# Patient Record
Sex: Male | Born: 2000 | Race: White | Hispanic: No | Marital: Single | Smoking: Current every day smoker
Health system: Southern US, Community
[De-identification: ages and names within clinical notes are randomized; demographics above are authoritative.]

## PROBLEM LIST (undated history)

## (undated) DIAGNOSIS — F419 Anxiety disorder, unspecified: Secondary | ICD-10-CM

---

## 2020-05-06 ENCOUNTER — Emergency Department (HOSPITAL_COMMUNITY)
Admission: EM | Admit: 2020-05-06 | Discharge: 2020-05-06 | Disposition: A | Discharge status: Home / Self Care | Payer: 59 | Attending: Emergency Medicine | Admitting: Emergency Medicine

## 2020-05-06 ENCOUNTER — Encounter (HOSPITAL_COMMUNITY): Payer: Self-pay | Admitting: Emergency Medicine

## 2020-05-06 ENCOUNTER — Emergency Department (HOSPITAL_COMMUNITY): Payer: 59

## 2020-05-06 DIAGNOSIS — R4182 Altered mental status, unspecified: Secondary | ICD-10-CM | POA: Diagnosis not present

## 2020-05-06 DIAGNOSIS — F17200 Nicotine dependence, unspecified, uncomplicated: Secondary | ICD-10-CM | POA: Insufficient documentation

## 2020-05-06 DIAGNOSIS — Z20822 Contact with and (suspected) exposure to covid-19: Secondary | ICD-10-CM | POA: Insufficient documentation

## 2020-05-06 DIAGNOSIS — F419 Anxiety disorder, unspecified: Secondary | ICD-10-CM | POA: Diagnosis not present

## 2020-05-06 DIAGNOSIS — F410 Panic disorder [episodic paroxysmal anxiety] without agoraphobia: Secondary | ICD-10-CM

## 2020-05-06 HISTORY — DX: Anxiety disorder, unspecified: F41.9

## 2020-05-06 LAB — URINALYSIS, ROUTINE W REFLEX MICROSCOPIC
Bilirubin Urine: NEGATIVE
Glucose, UA: NEGATIVE mg/dL
Hgb urine dipstick: NEGATIVE
Ketones, ur: NEGATIVE mg/dL
Leukocytes,Ua: NEGATIVE
Nitrite: NEGATIVE
Protein, ur: NEGATIVE mg/dL
Specific Gravity, Urine: 1.017 (ref 1.005–1.030)
pH: 7 (ref 5.0–8.0)

## 2020-05-06 LAB — RAPID URINE DRUG SCREEN, HOSP PERFORMED
Amphetamines: NOT DETECTED
Barbiturates: NOT DETECTED
Benzodiazepines: NOT DETECTED
Cocaine: NOT DETECTED
Opiates: NOT DETECTED
Tetrahydrocannabinol: POSITIVE — AB

## 2020-05-06 LAB — COMPREHENSIVE METABOLIC PANEL
ALT: 17 U/L (ref 0–44)
AST: 23 U/L (ref 15–41)
Albumin: 4.5 g/dL (ref 3.5–5.0)
Alkaline Phosphatase: 58 U/L (ref 38–126)
Anion gap: 9 (ref 5–15)
BUN: 10 mg/dL (ref 6–20)
CO2: 25 mmol/L (ref 22–32)
Calcium: 9.7 mg/dL (ref 8.9–10.3)
Chloride: 110 mmol/L (ref 98–111)
Creatinine, Ser: 1.02 mg/dL (ref 0.61–1.24)
GFR, Estimated: >60 mL/min (ref >60)
Glucose, Bld: 96 mg/dL (ref 70–99)
Potassium: 4.1 mmol/L (ref 3.5–5.1)
Sodium: 144 mmol/L (ref 135–145)
Total Bilirubin: 0.1 mg/dL — ABNORMAL LOW (ref 0.3–1.2)
Total Protein: 7 g/dL (ref 6.5–8.1)

## 2020-05-06 LAB — CBC WITH DIFFERENTIAL/PLATELET
Abs Immature Granulocytes: 0.01 10*3/uL (ref 0.00–0.07)
Basophils Absolute: 0.1 10*3/uL (ref 0.0–0.1)
Basophils Relative: 1 %
Eosinophils Absolute: 0.1 10*3/uL (ref 0.0–0.5)
Eosinophils Relative: 1 %
HCT: 42.8 % (ref 39.0–52.0)
Hemoglobin: 14.3 g/dL (ref 13.0–17.0)
Immature Granulocytes: 0 %
Lymphocytes Relative: 57 %
Lymphs Abs: 4.1 10*3/uL — ABNORMAL HIGH (ref 0.7–4.0)
MCH: 30.1 pg (ref 26.0–34.0)
MCHC: 33.4 g/dL (ref 30.0–36.0)
MCV: 90.1 fL (ref 80.0–100.0)
Monocytes Absolute: 0.6 10*3/uL (ref 0.1–1.0)
Monocytes Relative: 8 %
Neutro Abs: 2.3 10*3/uL (ref 1.7–7.7)
Neutrophils Relative %: 33 %
Platelets: 250 10*3/uL (ref 150–400)
RBC: 4.75 MIL/uL (ref 4.22–5.81)
RDW: 12.2 % (ref 11.5–15.5)
WBC: 7.2 10*3/uL (ref 4.0–10.5)
nRBC: 0 % (ref 0.0–0.2)

## 2020-05-06 LAB — RESP PANEL BY RT-PCR (FLU A&B, COVID) ARPGX2
Influenza A by PCR: NEGATIVE
Influenza B by PCR: NEGATIVE
SARS Coronavirus 2 by RT PCR: NEGATIVE

## 2020-05-06 LAB — SALICYLATE LEVEL: Salicylate Lvl: <7 mg/dL — ABNORMAL LOW (ref 7.0–30.0)

## 2020-05-06 LAB — ETHANOL: Alcohol, Ethyl (B): <10 mg/dL (ref <10)

## 2020-05-06 LAB — CBG MONITORING, ED: Glucose-Capillary: 93 mg/dL (ref 70–99)

## 2020-05-06 LAB — ACETAMINOPHEN LEVEL: Acetaminophen (Tylenol), Serum: <10 ug/mL — ABNORMAL LOW (ref 10–30)

## 2020-05-06 MED ORDER — SODIUM CHLORIDE 0.9 % IV BOLUS
1000.0000 mL | Freq: Once | INTRAVENOUS | Status: AC
  Administered 2020-05-06: 1000 mL via INTRAVENOUS

## 2020-05-06 NOTE — BH Assessment (Signed)
Comprehensive Clinical Assessment (CCA) Note  05/06/2020 Jesse Hunter [id number]  Disposition: [name] recommends patient be discharged with resources provided.   Flowsheet Row ED from 05/06/2020 in [facility]  C-SSRS RISK CATEGORY No Risk    The patient demonstrates the following risk factors for suicide: Chronic risk factors for suicide include: N/A. Acute risk factors for suicide include: N/A. Protective factors for this patient include: positive social support. Considering these factors, the overall suicide risk at this point appears to be low. Patient is appropriate for outpatient follow up.  Patient presents voluntary to [facility] with ongoing anxiety and altered mental state on arrival. Patient denies any S/I, H/I or AVH. Patient denies any prior attempts or gestures at self harm. Patient denies any prior mental health diagnosis. Patient denies any SA issues. Patient reports he recently relocated to [place] from [place] last week to be with his girlfriend who is present at bedside. Patient states prior to coming to [facility] his PCP wrote for a prescription of Vistaril to assist with the anxiety associated with relocating. Patient states he has been taking that medication as directed. Patient reports he suffered a "panic attack" last night while spending the evening with his girlfriend that left him "numb all over." Patient states he "was scared that episode might be something health related" and came to [facility] to "get checked out."   Per note on arrival by Muthersbaugh PA-C who writes: Jesse Hunter is a 20 y.o. male presents to the Emergency Department complaining of acute episode of anxiety and AMS.  Pt reports he recently moved to [facility] and has had increased anxiety since that time.  He reports he is prescribed hydroxyzine for this which he has been taking more often this week.  Pt reports taking this 2x per day.  Denies taking more or OD of this  medication. Pt denies drugs and alcohol usage.  Denies other psychiatric history.  Patient denies other stressors aside from the move.  Reports he cannot remember the events of this evening but states nothing like this is ever happened before.  Denies auditory visual hallucinations, ideations or homicidal ideations.  History is aided by patient's friend at bedside. She reports he has had several episodes of "AMS" this evening beginning around 7pm.  She reports when this happens he gest a far away look on his face, his eyes dart around the room very quickly and he does not respond to voice.  Pt responds only after being touched and when touched patient is very surprised and confused.  He does not know where he is or what is going on.   Collateral was obtained from patient's step brother with patient's permission who reports he had no safety concerns in reference to patient being discharged. Patient's girlfriend at bedside also reports patient to her knowledge never has attempted self harm and voices no safety concerns. Patient is contracting for safety and is requesting too be discharged. Patient was provided Op resources and encouraged to follow up for counseling and further medication management if symptoms continue.   Patient is alert and oriented presenting with a pleasant affect. Patient's memory is intact with thoughts organized. Patient does not appear to be responding to internal stimuli.    Chief Complaint:  Chief Complaint  Patient presents with  . Anxiety   Visit Diagnosis: Adjustment disorder     CCA Screening, Triage and Referral (STR)  Patient Reported Information How did you hear about [place]? Self  Referral name: No data  recorded Referral phone number: No data recorded  Whom do you see for routine medical problems? I don't have a doctor  Practice/Facility Name: No data recorded Practice/Facility Phone Number: No data recorded Name of Contact: No data recorded Contact Number:  No data recorded Contact Fax Number: No data recorded Prescriber Name: No data recorded Prescriber Address (if known): No data recorded  What Is the Reason for Your Visit/Call Today? Anxiety  How Long Has This Been Causing You Problems? <Week  What Do You Feel Would Help You the Most Today? -- (Counseling)   Have You Recently Been in Any Inpatient Treatment (Hospital/Detox/Crisis Center/28-Day Program)? No  Name/Location of Program/Hospital:No data recorded How Long Were You There? No data recorded When Were You Discharged? No data recorded  Have You Ever Received Services From [facility] Before? No  Who Do You See at [facility]? No data recorded  Have You Recently Had Any Thoughts About Hurting Yourself? No  Are You Planning to Commit Suicide/Harm Yourself At This time? No   Have you Recently Had Thoughts About Hurting Someone Jesse Hunter? No  Explanation: No data recorded  Have You Used Any Alcohol or Drugs in the Past 24 Hours? No  How Long Ago Did You Use Drugs or Alcohol? No data recorded What Did You Use and How Much? No data recorded  Do You Currently Have a Therapist/Psychiatrist? No  Name of Therapist/Psychiatrist: No data recorded  Have You Been Recently Discharged From Any Office Practice or Programs? No  Explanation of Discharge From Practice/Program: No data recorded    CCA Screening Triage Referral Assessment Type of Contact: Face-to-Face  Is this Initial or Reassessment? No data recorded Date Telepsych consult ordered in [facility]:  No data recorded Time Telepsych consult ordered in [facility]:  No data recorded  Patient Reported Information Reviewed? Yes  Patient Left Without Being Seen? No data recorded Reason for Not Completing Assessment: No data recorded  Collateral Involvement: Brother   Does Patient Have a Automotive engineer Guardian? No data recorded Name and Contact of Legal Guardian: No data recorded If Minor and Not Living with Parent(s), Who  has Custody? No data recorded Is CPS involved or ever been involved? Never  Is APS involved or ever been involved? Never   Patient Determined To Be At Risk for Harm To Self or Others Based on Review of Patient Reported Information or Presenting Complaint? No  Method: No data recorded Availability of Means: No data recorded Intent: No data recorded Notification Required: No data recorded Additional Information for Danger to Others Potential: No data recorded Additional Comments for Danger to Others Potential: No data recorded Are There Guns or Other Weapons in Your Home? No data recorded Types of Guns/Weapons: No data recorded Are These Weapons Safely Secured?                            No data recorded Who Could Verify You Are Able To Have These Secured: No data recorded Do You Have any Outstanding Charges, Pending Court Dates, Parole/Probation? No data recorded Contacted To Inform of Risk of Harm To Self or Others: -- (NA)   Location of Assessment: No data recorded  Does Patient Present under Involuntary Commitment? No  IVC Papers Initial File Date: No data recorded  [place] of Residence: [place]   Patient Currently Receiving the Following Services: Not Receiving Services   Determination of Need: No data recorded  Options For Referral: Outpatient Therapy  CCA Biopsychosocial Intake/Chief Complaint:  No data recorded Current Symptoms/Problems: No data recorded  Patient Reported Schizophrenia/Schizoaffective Diagnosis in Past: No data recorded  Strengths: No data recorded Preferences: No data recorded Abilities: No data recorded  Type of Services Patient Feels are Needed: No data recorded  Initial Clinical Notes/Concerns: No data recorded  Mental Health Symptoms Depression:  No data recorded  Duration of Depressive symptoms: No data recorded  Mania:  No data recorded  Anxiety:   No data recorded  Psychosis:  No data recorded  Duration of Psychotic  symptoms: No data recorded  Trauma:  No data recorded  Obsessions:  No data recorded  Compulsions:  No data recorded  Inattention:  No data recorded  Hyperactivity/Impulsivity:  No data recorded  Oppositional/Defiant Behaviors:  No data recorded  Emotional Irregularity:  No data recorded  Other Mood/Personality Symptoms:  No data recorded   Mental Status Exam Appearance and self-care  Stature:  No data recorded  Weight:  No data recorded  Clothing:  No data recorded  Grooming:  No data recorded  Cosmetic use:  No data recorded  Posture/gait:  No data recorded  Motor activity:  No data recorded  Sensorium  Attention:  No data recorded  Concentration:  No data recorded  Orientation:  No data recorded  Recall/memory:  No data recorded  Affect and Mood  Affect:  No data recorded  Mood:  No data recorded  Relating  Eye contact:  No data recorded  Facial expression:  No data recorded  Attitude toward examiner:  No data recorded  Thought and Language  Speech flow: No data recorded  Thought content:  No data recorded  Preoccupation:  No data recorded  Hallucinations:  No data recorded  Organization:  No data recorded  [employer] of Knowledge:  No data recorded  Intelligence:  No data recorded  Abstraction:  No data recorded  Judgement:  No data recorded  Reality Testing:  No data recorded  Insight:  No data recorded  Decision Making:  No data recorded  Social Functioning  Social Maturity:  No data recorded  Social Judgement:  No data recorded  Stress  Stressors:  No data recorded  Coping Ability:  No data recorded  Skill Deficits:  No data recorded  Supports:  No data recorded    Religion:    Leisure/Recreation:    Exercise/Diet:     CCA Employment/Education Employment/Work Situation:    Education:     CCA Family/Childhood History Family and Relationship History:    Childhood History:     Child/Adolescent Assessment:     CCA  Substance Use Alcohol/Drug Use:                           ASAM's:  Six Dimensions of Multidimensional Assessment  Dimension 1:  Acute Intoxication and/or Withdrawal Potential:      Dimension 2:  Biomedical Conditions and Complications:      Dimension 3:  Emotional, Behavioral, or Cognitive Conditions and Complications:     Dimension 4:  Readiness to Change:     Dimension 5:  Relapse, Continued use, or Continued Problem Potential:     Dimension 6:  Recovery/Living Environment:     ASAM Severity Score:    ASAM Recommended Level of Treatment:     Substance use Disorder (SUD)    Recommendations for Services/Supports/Treatments:    DSM5 Diagnoses: There are no problems to display for this patient.  Patient Centered Plan: Patient is on the following Treatment Plan(s):     Referrals to Alternative Service(s): Referred to Alternative Service(s):   Place:   Date:   Time:    Referred to Alternative Service(s):   Place:   Date:   Time:    Referred to Alternative Service(s):   Place:   Date:   Time:    Referred to Alternative Service(s):   Place:   Date:   Time:     Alfredia FergusonDavid L Joshuwa Vecchio, LCAS

## 2020-05-06 NOTE — Discharge Instructions (Signed)
He was seen in the ER by behavioral health team.  Please follow up with your primary care doctor, psychiatrist or consider following with behavioral health urgent care for further help is needed

## 2020-05-06 NOTE — ED Triage Notes (Signed)
Patient here from home reporting anxiety attack today. States that he normally takes hydroxyzine for anxiety and has been taking it more frequently. New to the area and has anxiety related to that. AAO x4 in triage.

## 2020-05-06 NOTE — ED Provider Notes (Signed)
  Physical Exam  BP (!) 108/56   Pulse (!) 42   Temp 98.3 F (36.8 C) (Oral)   Resp 16   Ht 6\' 2"  (1.88 m)   Wt 79.8 kg   SpO2 98%   BMI 22.60 kg/m   Physical Exam  ED Course/Procedures     Procedures  MDM  Patient's been seen by psychiatry and pending discharge.       , MD 05/06/20 1428

## 2020-05-06 NOTE — ED Provider Notes (Signed)
[facility]ER[facility] DEPT Provider Note   CSN: [id number] Arrival date & time: 05/06/20  0300     History Chief Complaint  Patient presents with  . Anxiety    Jesse Hunter is a 20 y.o. male presents to the Emergency Department complaining of acute episode of anxiety and AMS.  Pt reports he recently moved to [facility] and has had increased anxiety since that time.  He reports he is prescribed hydroxyzine for this which he has been taking more often this week.  Pt reports taking this 2x per day.  Denies taking more or OD of this medication. Pt denies drugs and alcohol usage.  Denies other psychiatric history.  Patient denies other stressors aside from the move.  Reports he cannot remember the events of this evening but states nothing like this is ever happened before.  Denies auditory visual hallucinations, ideations or homicidal ideations.  History is aided by patient's friend at bedside. She reports he has had several episodes of "AMS" this evening beginning around 7pm.  She reports when this happens he gest a far away look on his face, his eyes dart around the room very quickly and he does not respond to voice.  Pt responds only after being touched and when touched patient is very surprised and confused.  He does not know where he is or what is going on.   The history is provided by the patient, medical records and a friend. No language interpreter was used.       Past Medical History:  Diagnosis Date  . Anxiety     There are no problems to display for this patient.   History reviewed. No pertinent surgical history.     No family history on file.  Social History   Tobacco Use  . Smoking status: Current Every Day Smoker  . Smokeless tobacco: Never Used  Substance Use Topics  . Alcohol use: Never  . Drug use: Never    Home Medications Prior to Admission medications   Medication Sig Start Date End Date Taking? Authorizing Provider  hydrOXYzine  (ATARAX/VISTARIL) 25 MG tablet Take 25 mg by mouth every 6 (six) hours as needed for anxiety.   Yes [provider]    Allergies    Patient has no known allergies.  Review of Systems   Review of Systems  Constitutional: Negative for appetite change, diaphoresis, fatigue, fever and unexpected weight change.  HENT: Negative for mouth sores.   Eyes: Negative for visual disturbance.  Respiratory: Negative for cough, chest tightness, shortness of breath and wheezing.   Cardiovascular: Negative for chest pain.  Gastrointestinal: Negative for abdominal pain, constipation, diarrhea, nausea and vomiting.  Endocrine: Negative for polydipsia, polyphagia and polyuria.  Genitourinary: Negative for dysuria, frequency, hematuria and urgency.  Musculoskeletal: Negative for back pain and neck stiffness.  Skin: Negative for rash.  Allergic/Immunologic: Negative for immunocompromised state.  Neurological: Negative for syncope, light-headedness and headaches.  Hematological: Does not bruise/bleed easily.  Psychiatric/Behavioral: Positive for confusion and decreased concentration. Negative for sleep disturbance. The patient is nervous/anxious.     Physical Exam Updated Vital Signs BP 114/82 (BP Location: Left Arm)   Pulse 75   Temp 98.3 F (36.8 C) (Oral)   Resp 18   Ht 6\' 2"  (1.88 m)   Wt 79.8 kg   SpO2 95%   BMI 22.60 kg/m   Physical Exam Vitals and nursing note reviewed.  Constitutional:      General: He is not in acute distress.  Appearance: He is not diaphoretic.  HENT:     Head: Normocephalic.  Eyes:     General: No scleral icterus.    Conjunctiva/sclera: Conjunctivae normal.  Cardiovascular:     Rate and Rhythm: Normal rate and regular rhythm.     Pulses: Normal pulses.          Radial pulses are 2+ on the right side and 2+ on the left side.  Pulmonary:     Effort: No tachypnea, accessory muscle usage, prolonged expiration, respiratory distress or retractions.      Breath sounds: No stridor.     Comments: Equal chest rise. No increased work of breathing. Abdominal:     General: There is no distension.     Palpations: Abdomen is soft.     Tenderness: There is no abdominal tenderness. There is no guarding or rebound.  Musculoskeletal:     Cervical back: Normal range of motion.     Comments: Moves all extremities equally and without difficulty.  Skin:    General: Skin is warm and dry.     Capillary Refill: Capillary refill takes less than 2 seconds.  Neurological:     Mental Status: He is alert.     GCS: GCS eye subscore is 4. GCS verbal subscore is 5. GCS motor subscore is 6.     Comments: Speech is clear and goal oriented.  Psychiatric:        Attention and Perception: He is inattentive.        Mood and Affect: Mood is anxious. Affect is flat.        Speech: Speech is not delayed.        Behavior: Behavior is cooperative.     ED Results / Procedures / Treatments   Labs (all labs ordered are listed, but only abnormal results are displayed) Labs Reviewed  COMPREHENSIVE METABOLIC PANEL - Abnormal; Notable for the following components:      Result Value   Total Bilirubin 0.1 (*)    All other components within normal limits  CBC WITH DIFFERENTIAL/PLATELET - Abnormal; Notable for the following components:   Lymphs Abs 4.1 (*)    All other components within normal limits  SALICYLATE LEVEL - Abnormal; Notable for the following components:   Salicylate Lvl <7.0 (*)    All other components within normal limits  ACETAMINOPHEN LEVEL - Abnormal; Notable for the following components:   Acetaminophen (Tylenol), Serum <10 (*)    All other components within normal limits  RESP PANEL BY RT-PCR (FLU A&B, COVID) [id number]  ETHANOL  RAPID URINE DRUG SCREEN, HOSP PERFORMED  URINALYSIS, ROUTINE W REFLEX MICROSCOPIC  CBG MONITORING, ED    EKG EKG Interpretation  Date/Time:  Saturday May 06 2020 04:56:09 EDT Ventricular Rate:  54 PR  Interval:  163 QRS Duration: 105 QT Interval:  441 QTC Calculation: 418 R Axis:   64 Text Interpretation: Sinus rhythm ST elev, probable normal early repol pattern No old tracing to compare Confirmed by Drema Pry [phone]) on 05/06/2020 4:59:02 AM   Radiology CT Head Wo Contrast  Result Date: 05/06/2020 CLINICAL DATA:  20 year old male with mental status change. Possible anxiety attack. EXAM: CT HEAD WITHOUT CONTRAST TECHNIQUE: Contiguous axial images were obtained from the base of the skull through the vertex without intravenous contrast. COMPARISON:  No priors. FINDINGS: Brain: No evidence of acute infarction, hemorrhage, hydrocephalus, extra-axial collection or mass lesion/mass effect. Vascular: No hyperdense vessel or unexpected calcification. Skull: Normal. Negative for fracture or focal lesion. Sinuses/Orbits:  No acute finding. Other: None. IMPRESSION: 1. No acute intracranial abnormalities. The appearance of the brain is normal. Electronically Signed   By: Trudie Reed M.D.   On: 05/06/2020 05:31   DG Chest Port 1 View  Result Date: 05/06/2020 CLINICAL DATA:  20 year old male with symptoms of possible anxiety attack. EXAM: PORTABLE CHEST 1 VIEW COMPARISON:  No priors. FINDINGS: Lung volumes are normal. No consolidative airspace disease. No pleural effusions. No pneumothorax. No pulmonary nodule or mass noted. Pulmonary vasculature and the cardiomediastinal silhouette are within normal limits. IMPRESSION: No radiographic evidence of acute cardiopulmonary disease. Electronically Signed   By: Trudie Reed M.D.   On: 05/06/2020 05:30    Procedures Procedures   Medications Ordered in ED Medications  sodium chloride 0.9 % bolus 1,000 mL (1,000 mLs Intravenous New Bag/Given 05/06/20 1610)    ED Course  I have reviewed the triage vital signs and the nursing notes.  Pertinent labs & imaging results that were available during my care of the patient were reviewed by me and considered in  my medical decision making (see chart for details).    MDM Rules/Calculators/A&P                           Patient presents to the emergency department with anxiety and altered mental status.  Much of the history is provided by patient's friend at bedside whom he has been staying with for the last week.  She denies any previous episodes similar to this.   Patient does have an episode while I am in the room.  Clinically it appears that he is hallucinating and does not answer verbal questions.  When shaken he appears surprised and confused.  Patient and friend again deny drug or alcohol usage.  No patient family history of schizophrenia.  Work-up pending.  6:54 AM Labs reassuring.  No evidence of infectious process.  Afebrile without leukocytosis.  Negative for Covid.  Electrolytes within normal limits.  Pending UA and UDS.  CT head without acute abnormality.  I personally evaluated these images.  Chest x-ray without evidence of pneumonia or pneumothorax.  Concern for psychiatric cause of patient's symptoms.  Will TTS.   Final Clinical Impression(s) / ED Diagnoses Final diagnoses:  AMS (altered mental status)    Rx / DC Orders ED Discharge Orders    None       Jordi Kamm, Boyd Kerbs 05/06/20 9604    Nira Conn, MD 05/07/20 [id number]

## 2021-08-22 IMAGING — CT CT HEAD W/O CM
3 series · 16 of 47 positions shown, 19 images · non-contrast
Comparison: No priors.

CLINICAL DATA: 19-year-old male with mental status change. Possible
anxiety attack.

EXAM:
CT HEAD WITHOUT CONTRAST
TECHNIQUE: Contiguous axial images were obtained from the base of the skull
through the vertex without intravenous contrast.

[Series 2: head wo · axial · 0.44mm/px · z∈[-128,+7]mm · 10 of 33 slices shown, 13 images]
[im 3/33  brain]
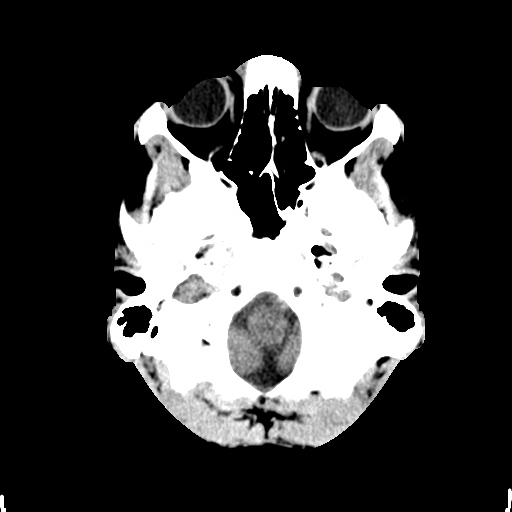
[im 3/33  bone]
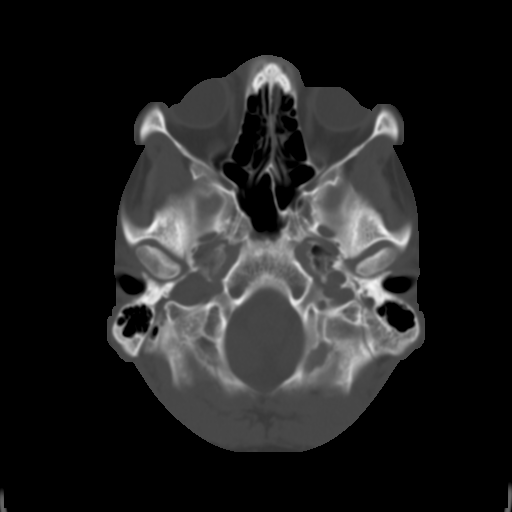
[im 6/33  brain]
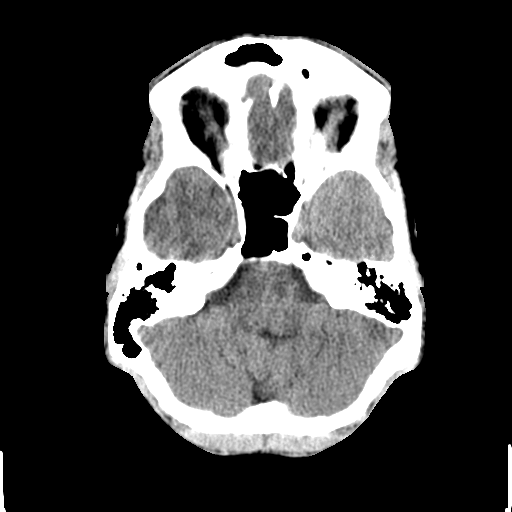
[im 9/33  brain]
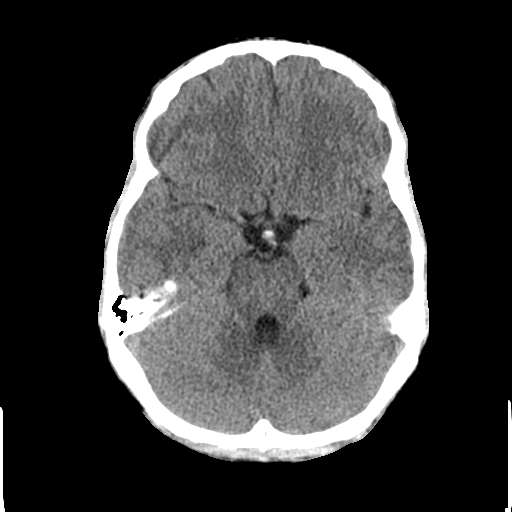
[im 12/33  brain]
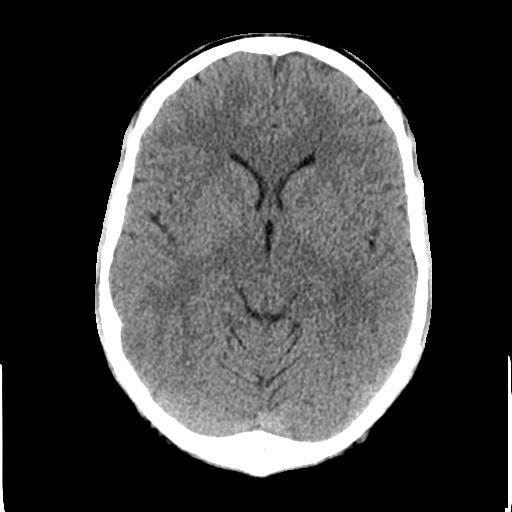
[im 15/33  brain]
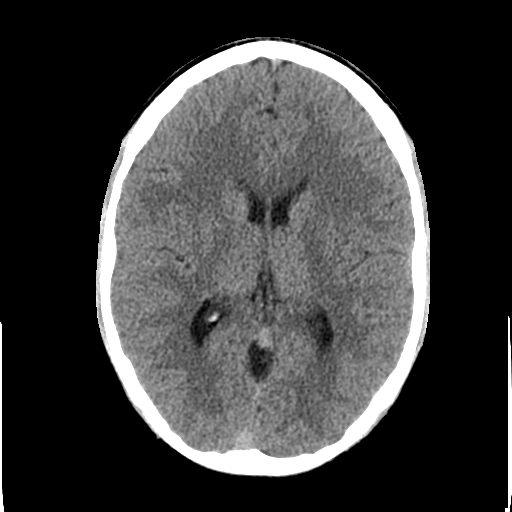
[im 15/33  bone]
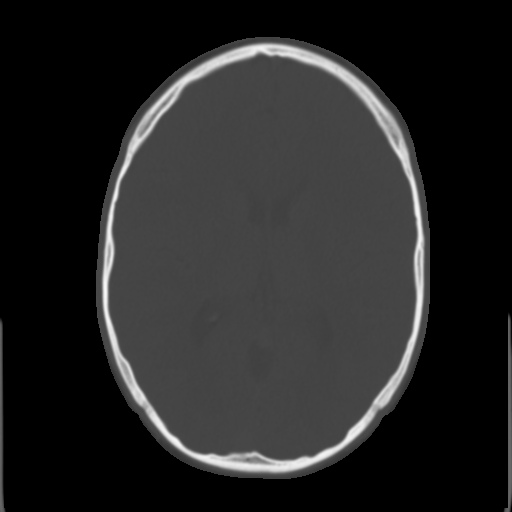
[im 18/33  brain]
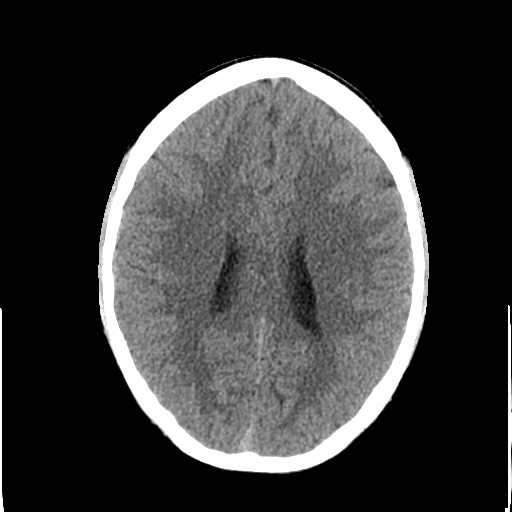
[im 21/33  brain]
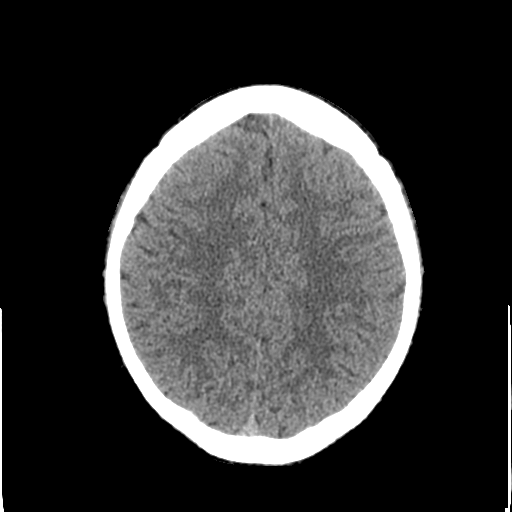
[im 25/33  brain]
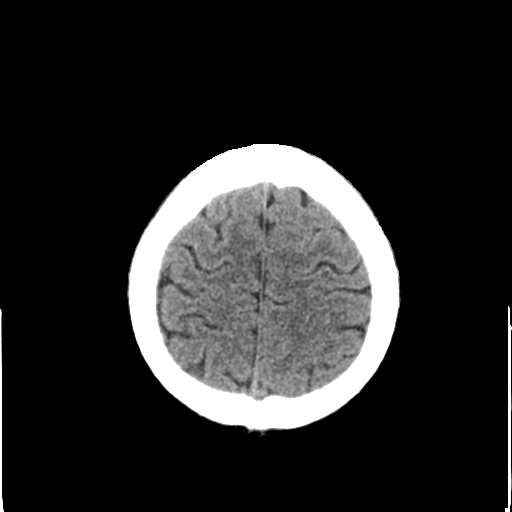
[im 27/33  brain]
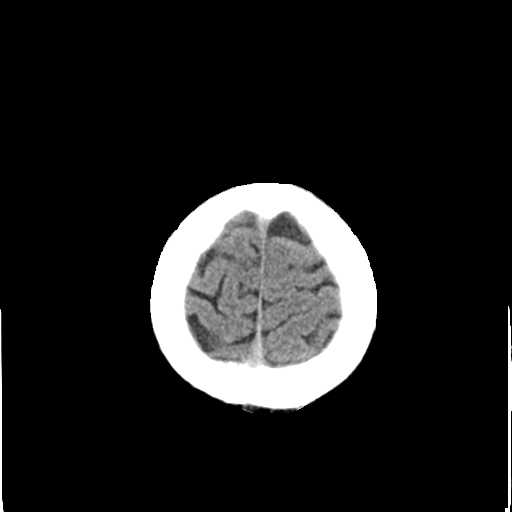
[im 27/33  bone]
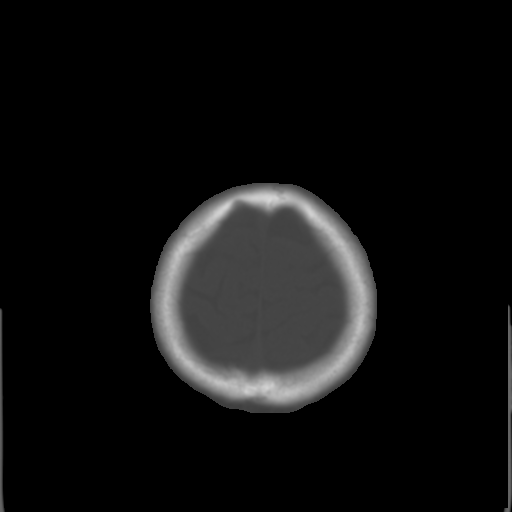
[im 30/33  brain]
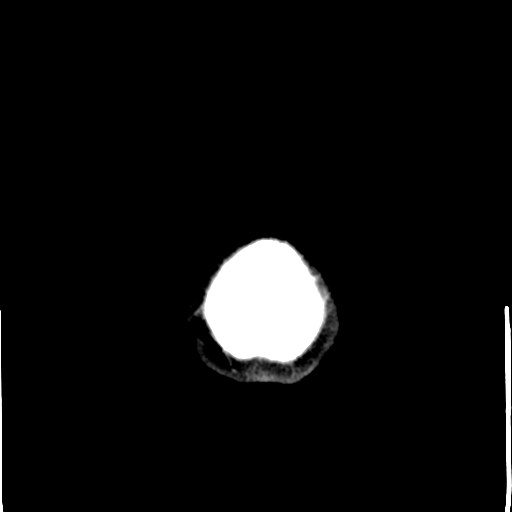

[Series 5: coronal soft tissue · coronal · 0.34mm/px · 3 of 73 slices shown]
[im 25/73  brain]
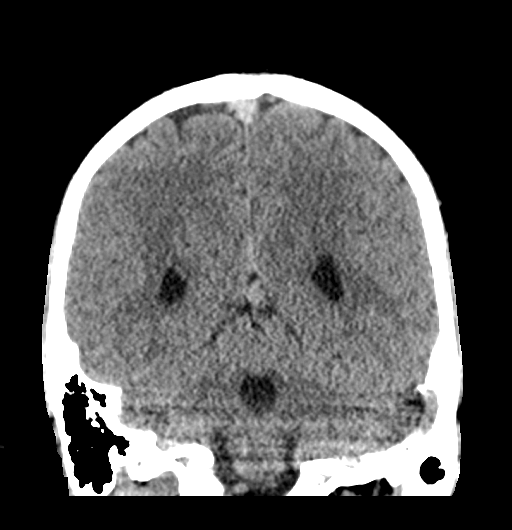
[im 33/73  brain]
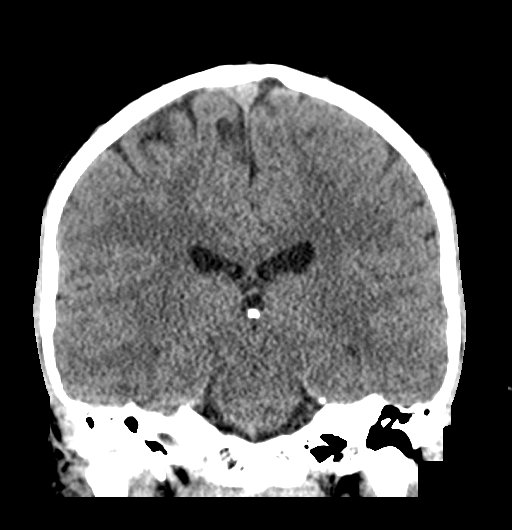
[im 41/73  brain]
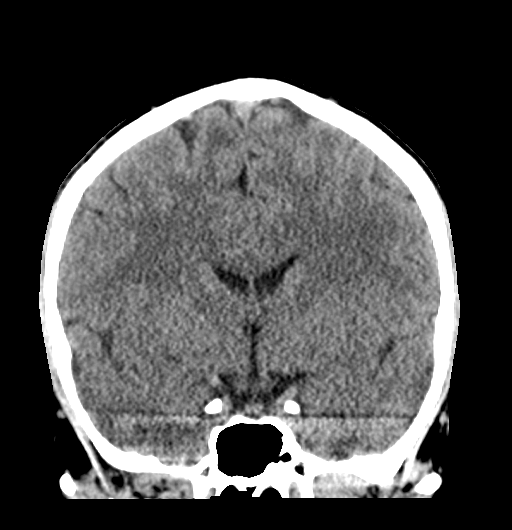

[Series 6: sagittal soft tissue · sagittal · 0.35mm/px · 3 of 61 slices shown]
[im 21/61  brain]
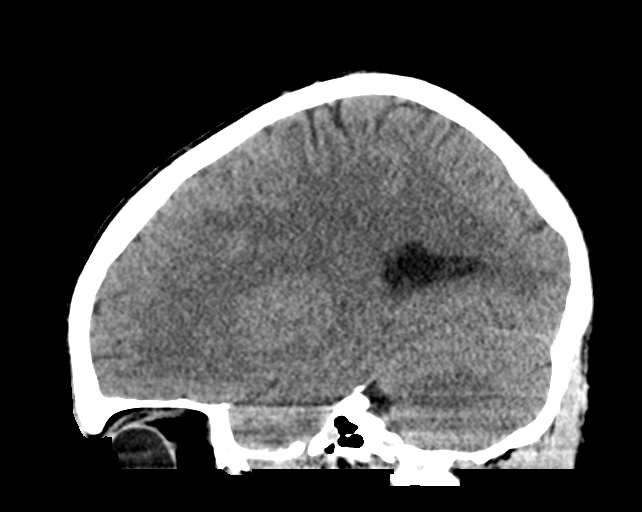
[im 31/61  brain]
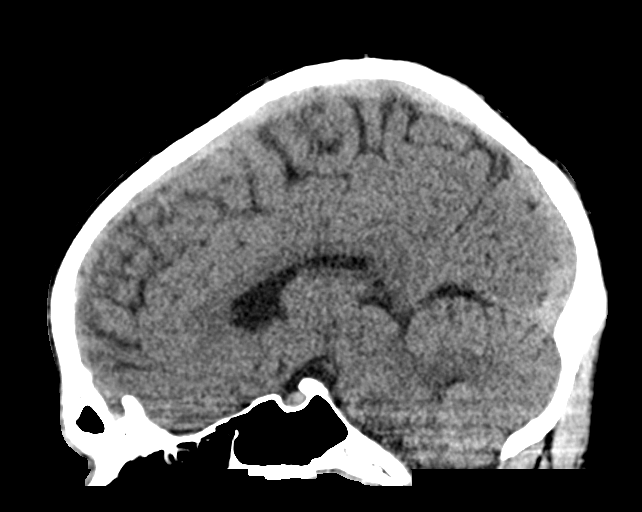
[im 41/61  brain]
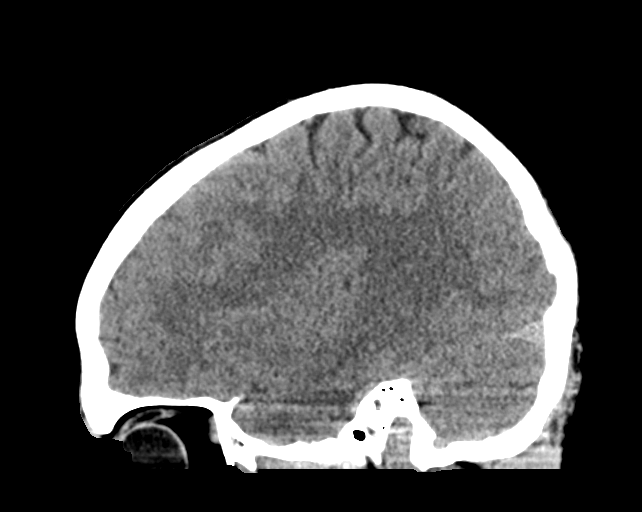

[16 of 47 positions shown; findings below may reference images not displayed]

FINDINGS: Brain: No evidence of acute infarction, hemorrhage, hydrocephalus,
extra-axial collection or mass lesion/mass effect.

Vascular: No hyperdense vessel or unexpected calcification.

Skull: Normal. Negative for fracture or focal lesion.

Sinuses/Orbits: No acute finding.

Other: None.
IMPRESSION: 1. No acute intracranial abnormalities. The appearance of the brain
is normal.

## 2021-08-22 IMAGING — DX DG CHEST 1V PORT
2 series · 2 of 2 positions shown · non-contrast
Comparison: No priors.

CLINICAL DATA: 19-year-old male with symptoms of possible anxiety
attack.

EXAM:
PORTABLE CHEST 1 VIEW

[chest ap (1 of 2)]
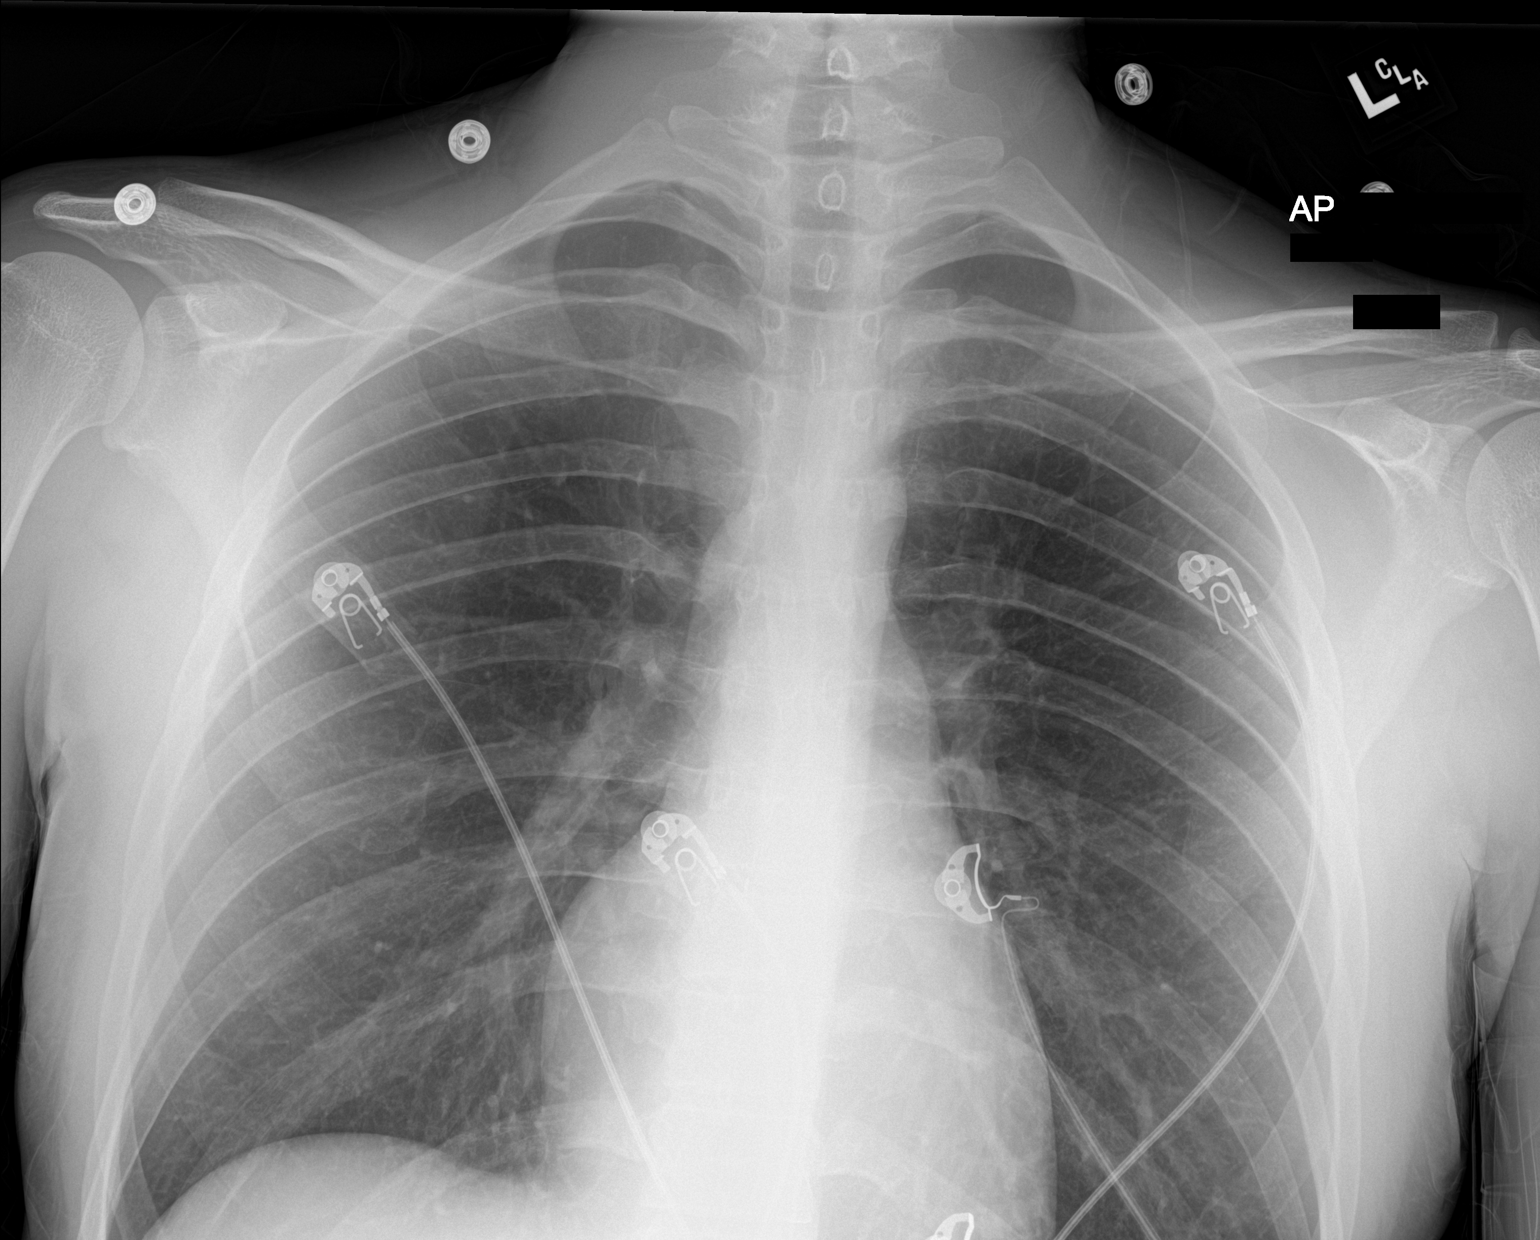

[chest ap (2 of 2)]
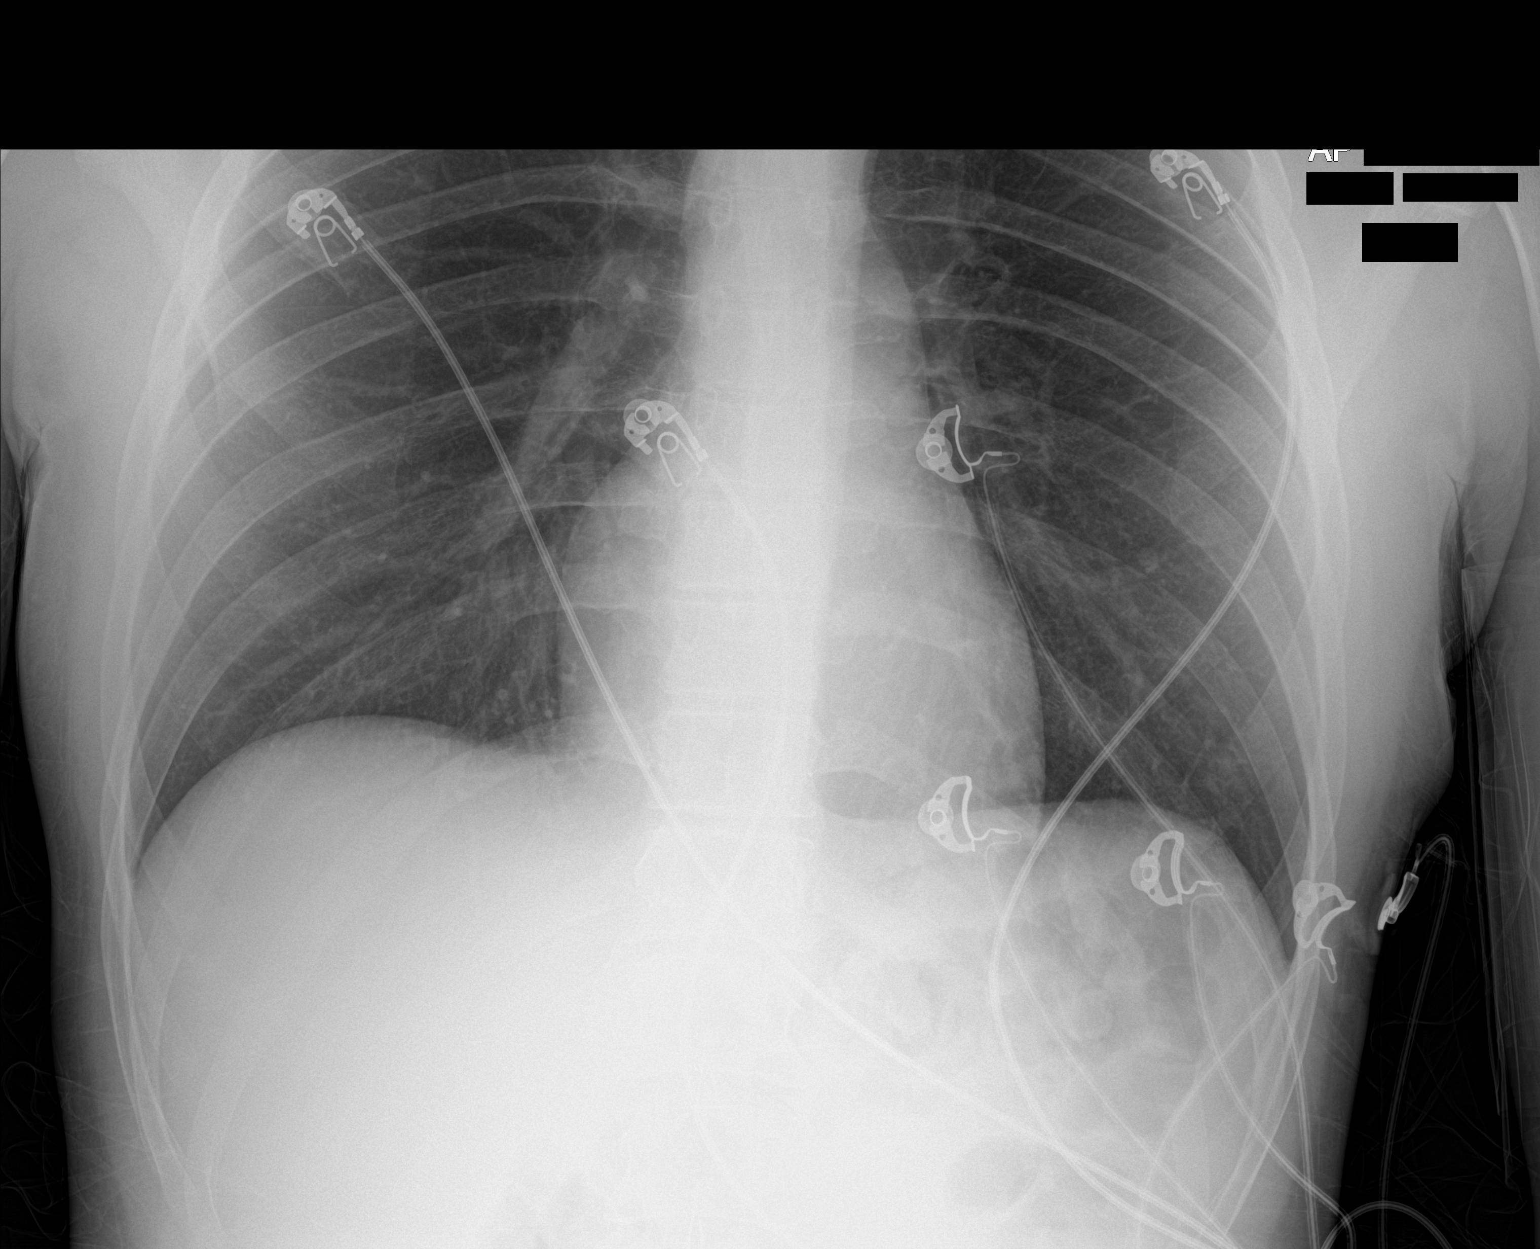

[2 of 2 positions shown; findings below may reference images not displayed]

FINDINGS: Lung volumes are normal. No consolidative airspace disease. No
pleural effusions. No pneumothorax. No pulmonary nodule or mass
noted. Pulmonary vasculature and the cardiomediastinal silhouette
are within normal limits.
IMPRESSION: No radiographic evidence of acute cardiopulmonary disease.
